# Patient Record
Sex: Female | Born: 1995 | Race: White | Hispanic: No | Marital: Single | Smoking: Never smoker
Health system: Southern US, Community
[De-identification: ages and names within clinical notes are randomized; demographics above are authoritative.]

---
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
## 2014-01-18 ENCOUNTER — Ambulatory Visit (INDEPENDENT_AMBULATORY_CARE_PROVIDER_SITE_OTHER): Payer: No Typology Code available for payment source | Admitting: Physician Assistant

## 2014-01-18 ENCOUNTER — Encounter: Payer: Self-pay | Admitting: Physician Assistant

## 2014-01-18 VITALS — BP 100/68 | HR 80 | Temp 97.9°F | Ht 64.0 in | Wt 131.0 lb

## 2014-01-18 DIAGNOSIS — Z309 Encounter for contraceptive management, unspecified: Secondary | ICD-10-CM | POA: Insufficient documentation

## 2014-01-18 DIAGNOSIS — Z3009 Encounter for other general counseling and advice on contraception: Secondary | ICD-10-CM

## 2014-01-18 DIAGNOSIS — Z30011 Encounter for initial prescription of contraceptive pills: Secondary | ICD-10-CM

## 2014-01-18 MED ORDER — DESOGESTREL-ETHINYL ESTRADIOL 0.15-0.02/0.01 MG (21/5) PO TABS
1.0000 | ORAL_TABLET | Freq: Every day | ORAL | Status: AC
Start: 2014-01-18 — End: ?

## 2014-01-18 NOTE — Progress Notes (Signed)
    Patient ID: AARUSHI HEMRIC MRN: [id number], DOB: 10-20-1995, 18 y.o. Date of Encounter: 01/18/2014, 3:16 PM    Chief Complaint:  Chief Complaint  Patient presents with  .     per patient-  needing birth control     HPI: 18 y.o. year old white female here to discuss contraception. She says that she has already thought about it and has decided she wants to do birth control pill. Says that she can set an alarm on herself and to make sure she takes it on a daily basis. She says that she just ended her menstrual cycle yesterday. She says that her menses come on very regularly every 28 days. She says that they last about 5 days and aren't kind of heavy in the beginning but then did very light. She reports that she has very little cramping.  She has no GYN history the She has no history of hypertension. She does not smoke.     Home Meds:  None  Allergies:  Allergies  Allergen Reactions  . Excedrin Back & [Acetaminophen-Aspirin Buffered]     unknown      Review of Systems: See HPI for pertinent ROS. All other ROS negative.    Physical Exam: Blood pressure 100/68, pulse 80, temperature 97.9 F (36.6 C), temperature source Oral, height  (1.626 m), weight 131 lb (59.421 kg)., Body mass index is 22.47 kg/(m^2). General: WNWD WF.  Appears in no acute distress. Neck: Supple. No thyromegaly. No lymphadenopathy. Lungs: Clear bilaterally to auscultation without wheezes, rales, or rhonchi. Breathing is unlabored. Heart: Regular rhythm. No murmurs, rubs, or gallops. Abdomen: Soft, non-tender, non-distended with normoactive bowel sounds. No hepatomegaly. No rebound/guarding. No obvious abdominal masses. Msk:  Strength and tone normal for age. Extremities/Skin: Warm and dry. Neuro: Alert and oriented X 3. Moves all extremities spontaneously. Gait is normal. CNII-XII grossly in tact. Psych:  Responds to questions appropriately with a normal affect.     ASSESSMENT AND PLAN:    18 y.o. year old female with  1. Encounter for initial prescription of contraceptive pills - desogestrel-ethinyl estradiol (KARIVA,AZURETTE,MIRCETTE) 0.15-0.02/0.01 MG (21/5) tablet; Take 1 tablet by mouth daily.  Dispense: 1 Package; Refill: 11 I discussed how to take this properly including when to start the pills and to take them the same time of day each day. Also discussed to make sure to use another contraceptive method for the first 1-2 months. Followup in one year or followup sooner if needed.  [address] [place], [place], [facility] 01/18/2014 3:16 PM

## 2014-02-21 ENCOUNTER — Telehealth: Payer: Self-pay | Admitting: Physician Assistant

## 2014-02-21 NOTE — Telephone Encounter (Signed)
Pt wanted to know when she could have unprotected sex.  She said the provider said to wait until she has completed at least two packets.  She said she is thur 1 1/2.  Told patient to follow provider directions.

## 2014-02-21 NOTE — Telephone Encounter (Signed)
Patient is calling to speak with you regarding her birth control  Please call her back at [phone]

## 2014-03-07 ENCOUNTER — Ambulatory Visit: Payer: No Typology Code available for payment source | Admitting: Physician Assistant

## 2014-03-09 ENCOUNTER — Ambulatory Visit (INDEPENDENT_AMBULATORY_CARE_PROVIDER_SITE_OTHER): Payer: No Typology Code available for payment source | Admitting: Family Medicine

## 2014-03-09 ENCOUNTER — Encounter: Payer: Self-pay | Admitting: Family Medicine

## 2014-03-09 VITALS — BP 118/68 | HR 78 | Temp 98.1°F | Resp 14 | Ht 63.0 in | Wt 132.0 lb

## 2014-03-09 DIAGNOSIS — M792 Neuralgia and neuritis, unspecified: Secondary | ICD-10-CM

## 2014-03-09 NOTE — Progress Notes (Addendum)
Patient ID: Sara Arnold, female   DOB: 12/10/1995, 18 y.o.   MRN: [id number]   Subjective:    Patient ID: Sara CoddingtonSavannah G Arnold, female    DOB: 08/04/1995, 18 y.o.   MRN: [id number]  Patient presents for Hand Numbness patient here with intermittent numbness of her right hand. She states that she has been donating plasma month ago she donated plasma and she had a traumatic experience since then she had pain and numbness at the elbow site but then she would get some tingling in her fingertips. This over the past couple weeks has slowly faded the last time she had any tingling was a week ago. She went back to give plasma when she told them about the event they told her that she needs to be seen by her physician. She does not work on a computer on a regular basis otherwise has no pain in her joints has not dropped any items.    Review Of Systems:  GEN- denies fatigue, fever, weight loss,weakness, recent illness HEENT- denies eye drainage, change in vision, nasal discharge, CVS- denies chest pain, palpitations RESP- denies SOB, cough, wheeze ABD- denies N/V, change in stools, abd pain GU- denies dysuria, hematuria, dribbling, incontinence MSK- denies joint pain, muscle aches, injury Neuro- denies headache, dizziness, syncope, seizure activity       Objective:    BP 118/68 mmHg  Pulse 78  Temp(Src) 98.1 F (36.7 C) (Oral)  Resp 14  Ht 5\' 3"  (1.6 m)  Wt 132 lb (59.875 kg)  BMI 23.39 kg/m2  LMP 03/02/2014 GEN- NAD, alert and oriented x3 MSK- FROM upper ext, and elbow, skin normal, neg  Neuro- normal strength UE 5/5, DTR symmetric, normal tone, phalens, neg tinels, normal grasp        Assessment & Plan:      Problem List Items Addressed This Visit    None    Visit Diagnoses    Neuropathic pain    -  Possible due to nerve irritation with the traumatic blood draw, already resolved, no further intervention,        Note: This dictation was prepared with Dragon dictation along  with smaller phrase technology. Any transcriptional errors that result from this process are unintentional.

## 2014-03-09 NOTE — Patient Instructions (Signed)
No medications needed Nerve pain will heal itself F/U as needed

## 2014-03-15 ENCOUNTER — Telehealth: Payer: Self-pay | Admitting: Physician Assistant

## 2014-12-24 ENCOUNTER — Encounter (HOSPITAL_COMMUNITY): Payer: Self-pay | Admitting: Vascular Surgery

## 2014-12-24 ENCOUNTER — Emergency Department (HOSPITAL_COMMUNITY)
Admission: EM | Admit: 2014-12-24 | Discharge: 2014-12-25 | Disposition: A | Discharge status: Home / Self Care | Payer: Self-pay | Attending: Emergency Medicine | Admitting: Emergency Medicine

## 2014-12-24 ENCOUNTER — Emergency Department (HOSPITAL_COMMUNITY): Payer: Self-pay

## 2014-12-24 DIAGNOSIS — Z3202 Encounter for pregnancy test, result negative: Secondary | ICD-10-CM | POA: Insufficient documentation

## 2014-12-24 DIAGNOSIS — Z79899 Other long term (current) drug therapy: Secondary | ICD-10-CM | POA: Insufficient documentation

## 2014-12-24 DIAGNOSIS — R519 Headache, unspecified: Secondary | ICD-10-CM

## 2014-12-24 DIAGNOSIS — R51 Headache: Secondary | ICD-10-CM | POA: Insufficient documentation

## 2014-12-24 DIAGNOSIS — F419 Anxiety disorder, unspecified: Secondary | ICD-10-CM | POA: Insufficient documentation

## 2014-12-24 DIAGNOSIS — F410 Panic disorder [episodic paroxysmal anxiety] without agoraphobia: Secondary | ICD-10-CM

## 2014-12-24 LAB — I-STAT BETA HCG BLOOD, ED (MC, WL, AP ONLY): I-stat hCG, quantitative: <5 m[IU]/mL (ref <5)

## 2014-12-24 NOTE — ED Provider Notes (Signed)
CSN: [id number]     Arrival date & time 12/24/14  1836 History   First MD Initiated Contact with Patient 12/24/14 2149     Chief Complaint  Patient presents with  . Headache  . Panic Attack     (Consider location/radiation/quality/duration/timing/severity/associated sxs/prior Treatment) HPI Comments: The patient is a 19 year old female, she states that she is "prone to anxiety". She also has intermittent headaches in the past however over the last 3 weeks she has had frequent headaches, they come on for an hour then they go away for a couple hours then they come back. They're usually located across the frontal area but sometimes bitemporal. She has had 2 episodes of near-syncope over the last several days, she is having frequent panic attacks which she describes as chest discomfort, episodes of heavy rapid breathing and feeling that her vision is closing in. This then resolved spontaneously and she gets back to normal. Today has been relatively benign but she wanted to get checked out because of the progressive nature of her headaches. She denies numbness weakness difficulty ambulating or speech difficulties. She has been taking ibuprofen and occasional BC powder.  Patient is a 19 y.o. female presenting with headaches. The history is provided by the patient.  Headache   History reviewed. No pertinent past medical history. History reviewed. No pertinent past surgical history. No family history on file. Social History  Substance Use Topics  . Smoking status: Never Smoker   . Smokeless tobacco: None  . Alcohol Use: Yes     Comment: occasionally   OB History    No data available     Review of Systems  Neurological: Positive for headaches.  All other systems reviewed and are negative.     Allergies  Excedrin extra strength  Home Medications   Prior to Admission medications   Medication Sig Start Date End Date Taking? Authorizing Provider  desogestrel-ethinyl estradiol  (KARIVA,AZURETTE,MIRCETTE) 0.15-0.02/0.01 MG (21/5) tablet Take 1 tablet by mouth daily. Patient not taking: Reported on 12/24/2014 01/18/14   Dorena Bodo, PA-C  ibuprofen (ADVIL,MOTRIN) 800 MG tablet Take 1 tablet (800 mg total) by mouth 3 (three) times daily. 12/25/14   Eber Hong, MD  SUMAtriptan (IMITREX) 25 MG tablet Take 2 tablets (50 mg total) by mouth every 2 (two) hours as needed for migraine (ongoing headache). Maximum daily dose  12/25/14   Eber Hong, MD   BP 103/48 mmHg  Pulse 92  Temp(Src) 98 F (36.7 C) (Oral)  Resp 16  SpO2 97%  LMP 12/10/2014 (Approximate) Physical Exam  Constitutional: She appears well-developed and well-nourished. No distress.  HENT:  Head: Normocephalic and atraumatic.  Mouth/Throat: Oropharynx is clear and moist. No oropharyngeal exudate.  Eyes: Conjunctivae and EOM are normal. Pupils are equal, round, and reactive to light. Right eye exhibits no discharge. Left eye exhibits no discharge. No scleral icterus.  Neck: Normal range of motion. Neck supple. No JVD present. No thyromegaly present.  Cardiovascular: Normal rate, regular rhythm, normal heart sounds and intact distal pulses.  Exam reveals no gallop and no friction rub.   No murmur heard. Pulmonary/Chest: Effort normal and breath sounds normal. No respiratory distress. She has no wheezes. She has no rales.  Abdominal: Soft. Bowel sounds are normal. She exhibits no distension and no mass. There is no tenderness.  Musculoskeletal: Normal range of motion. She exhibits no edema or tenderness.  Lymphadenopathy:    She has no cervical adenopathy.  Neurological: She is alert. Coordination normal.  Neurologic  exam:  Speech clear, pupils equal round reactive to light, extraocular movements intact  Normal peripheral visual fields Cranial nerves III through XII normal including no facial droop Follows commands, moves all extremities x4, normal strength to bilateral upper and lower extremities at  all major muscle groups including grip Sensation normal to light touch and pinprick Coordination intact, no limb ataxia, finger-nose-finger normal Rapid alternating movements normal No pronator drift Gait normal   Skin: Skin is warm and dry. No rash noted. No erythema.  Psychiatric: She has a normal mood and affect. Her behavior is normal.  Nursing note and vitals reviewed.   ED Course  Procedures (including critical care time) Labs Review Labs Reviewed  I-STAT CHEM 8, ED  I-STAT BETA HCG BLOOD, ED ([facility], [facility], [facility] ONLY)    Imaging Review Ct Head Wo Contrast  12/24/2014   CLINICAL DATA:  Headaches and panic attacks. Intermittent headaches for 2-3 weeks. Dizziness tonight. Syncope. No known injury.  EXAM: CT HEAD WITHOUT CONTRAST  TECHNIQUE: Contiguous axial images were obtained from the base of the skull through the vertex without intravenous contrast.  COMPARISON:  None.  FINDINGS: Ventricles and sulci appear symmetrical. No mass effect or midline shift. No abnormal extra-axial fluid collections. Gray-white matter junctions are distinct. Basal cisterns are not effaced. No evidence of acute intracranial hemorrhage. No depressed skull fractures. Visualized paranasal sinuses and mastoid air cells are not opacified.  IMPRESSION: No acute intracranial abnormalities.   Electronically Signed   By: Burman Nieves M.D.   On: 12/24/2014 23:21   I have personally reviewed and evaluated these images and lab results as part of my medical decision-making.    MDM   Final diagnoses:  Nonintractable headache, unspecified chronicity pattern, unspecified headache type  Anxiety attack    The patient is unremarkable, normal vital signs, normal neurologic exam, the patient is extremely nervous that she has an aneurysm. We'll obtain a CT scan of the brain, check chemistry, check pregnancy, no headache at this time.  Pt stable, no c/o at thsi time of d/c - informed of results - she is in a  greement.  Meds given in ED:  Medications - No data to display  New Prescriptions   IBUPROFEN (ADVIL,MOTRIN) 800 MG TABLET    Take 1 tablet (800 mg total) by mouth 3 (three) times daily.   SUMATRIPTAN (IMITREX) 25 MG TABLET    Take 2 tablets (50 mg total) by mouth every 2 (two) hours as needed for migraine (ongoing headache). Maximum daily dose 200mg       Eber Hong, MD 12/25/14 0001

## 2014-12-24 NOTE — ED Notes (Addendum)
Pt reports to the ED for eval of HAs and panic attacks. She reports she has had intermittent HAs x 2-3 weeks. She reports she has been getting them every day. She reports that occasionally they are migraine in nature. She reports with those she has light and sounds sensitivity. Also reports increased episodes of panic attacks. Pt denies any head injury or N/V. Pt A&OX4, resp e/u, and skin warm and dry. Denies any HA at this time.

## 2014-12-25 MED ORDER — SUMATRIPTAN SUCCINATE 25 MG PO TABS: 50.0000 mg | ORAL_TABLET | ORAL | Status: AC | PRN

## 2014-12-25 MED ORDER — IBUPROFEN 800 MG PO TABS: 800.0000 mg | ORAL_TABLET | Freq: Three times a day (TID) | ORAL | Status: AC

## 2014-12-25 NOTE — Discharge Instructions (Signed)

## 2016-03-28 IMAGING — CT CT HEAD W/O CM
1 series · 16 of 29 positions shown, 20 images · non-contrast
Comparison: None.

CLINICAL DATA: Headaches and panic attacks. Intermittent headaches
for 2-3 weeks. Dizziness tonight. Syncope. No known injury.

EXAM:
CT HEAD WITHOUT CONTRAST
TECHNIQUE: Contiguous axial images were obtained from the base of the skull
through the vertex without intravenous contrast.

[Series 2: head 5.0 h30s · axial · 0.41mm/px · z∈[+1118,+1248]mm · 16 of 29 slices shown, 20 images]
[im 2/29  brain]
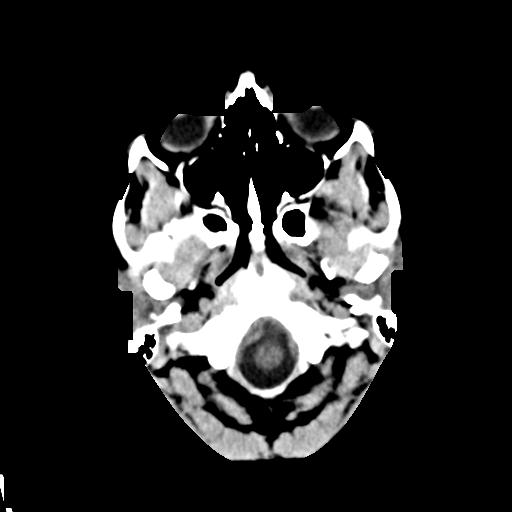
[im 2/29  bone]
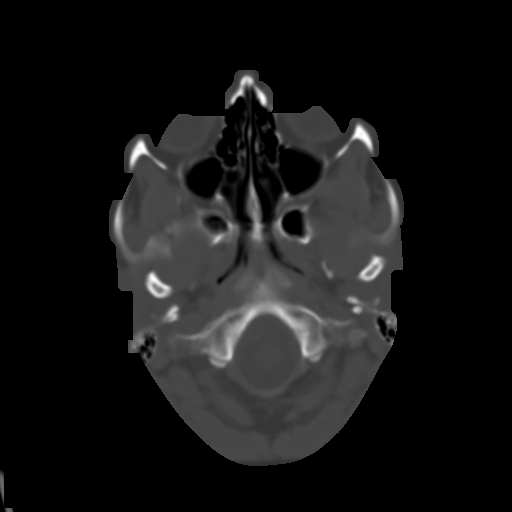
[im 4/29  brain]
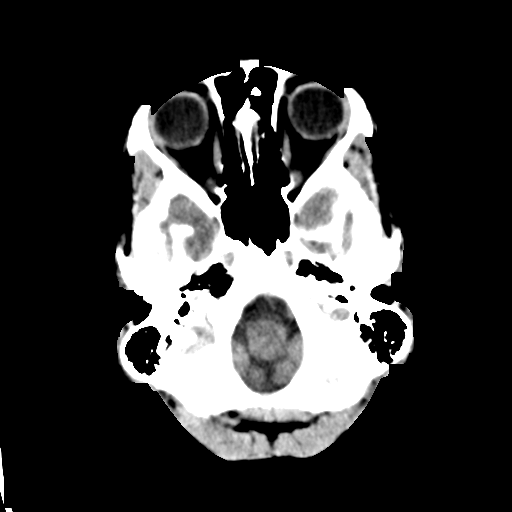
[im 6/29  brain]
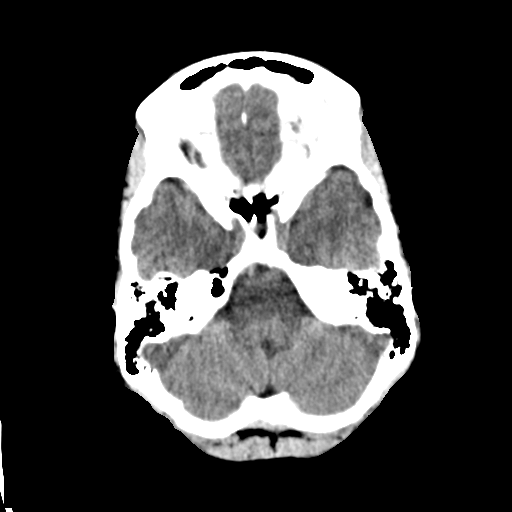
[im 7/29  brain]
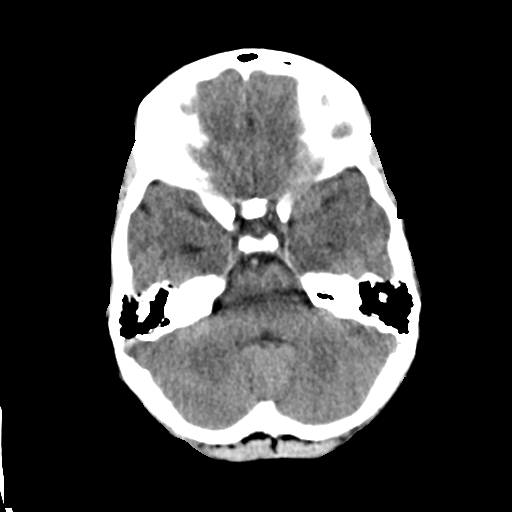
[im 9/29  brain]
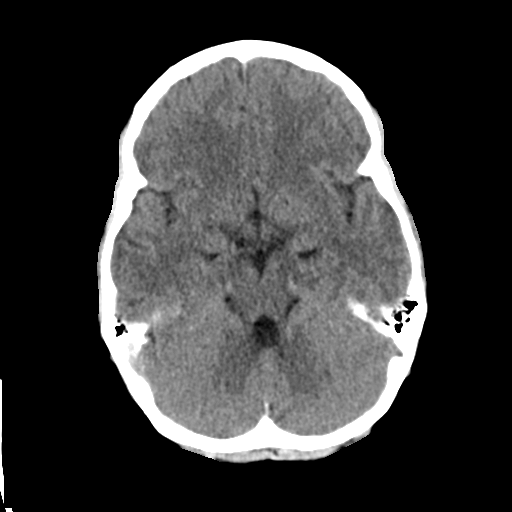
[im 9/29  bone]
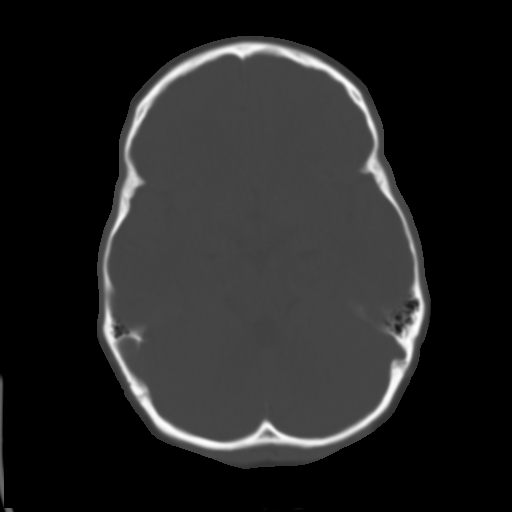
[im 11/29  brain]
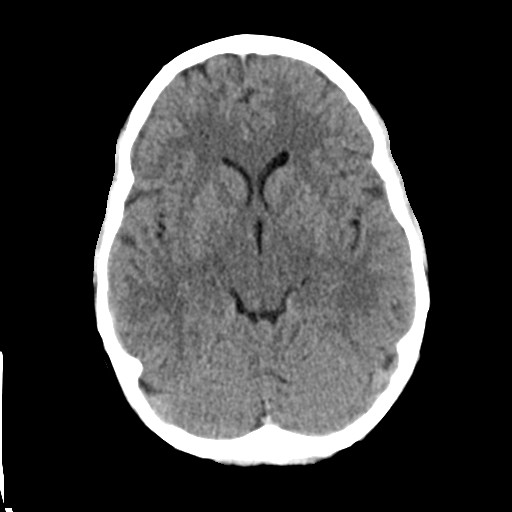
[im 12/29  brain]
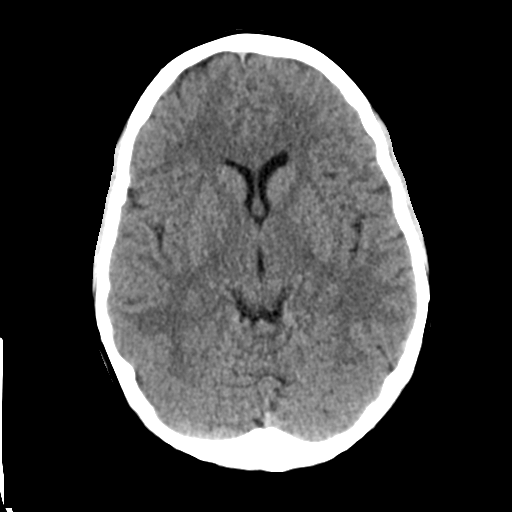
[im 14/29  brain]
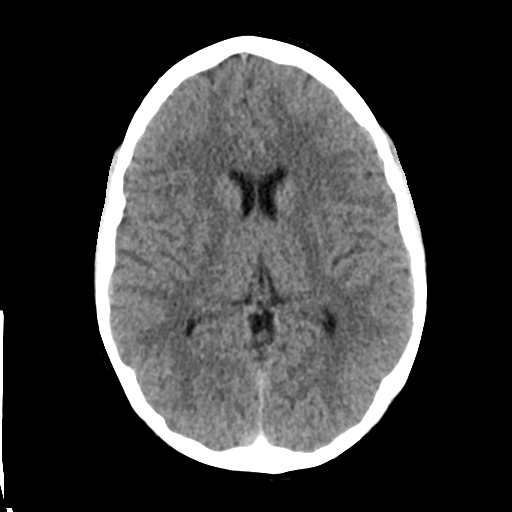
[im 16/29  brain]
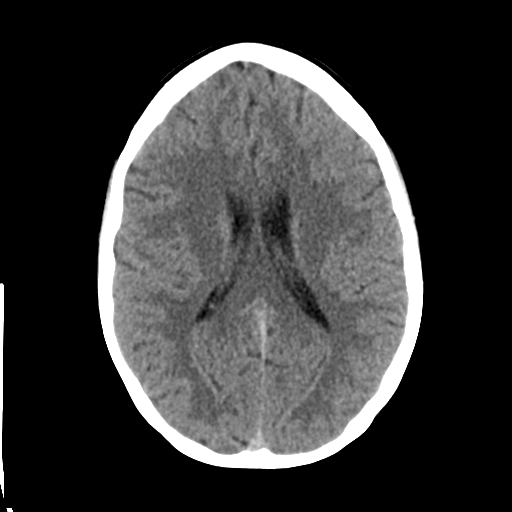
[im 16/29  bone]
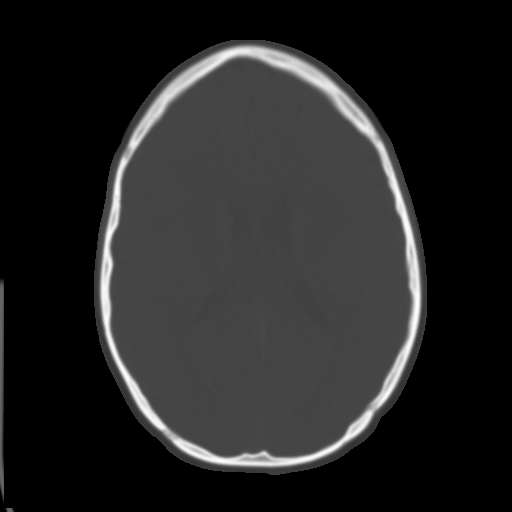
[im 18/29  brain]
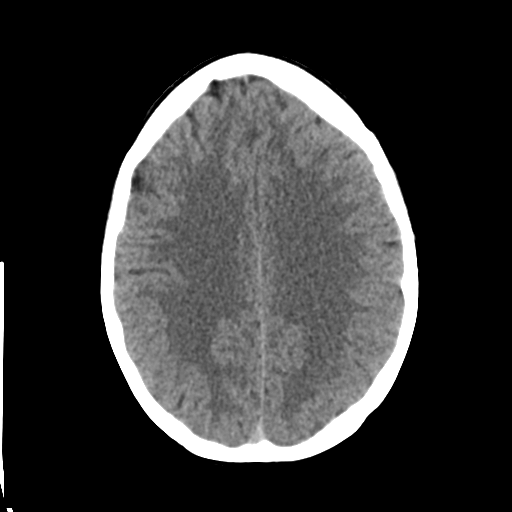
[im 19/29  brain]
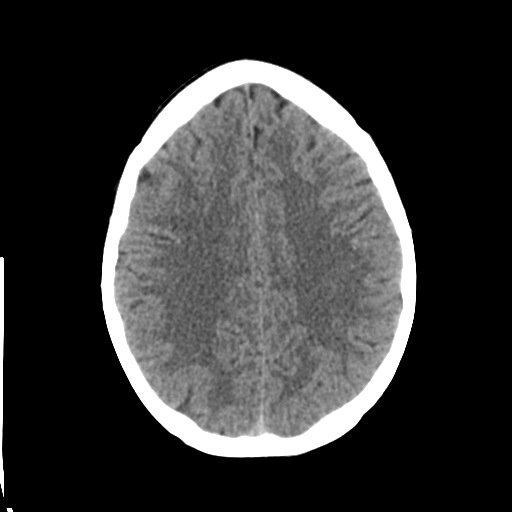
[im 21/29  brain]
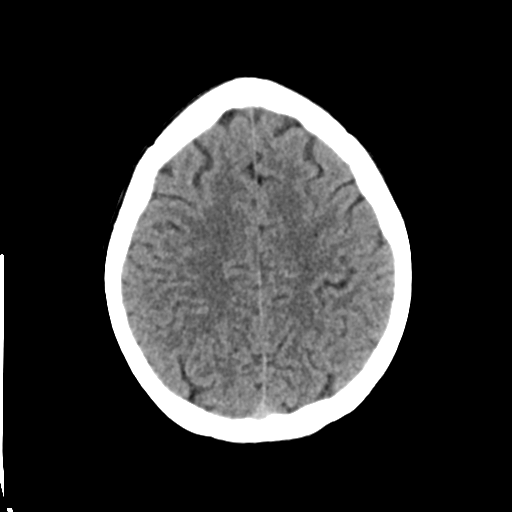
[im 23/29  brain]
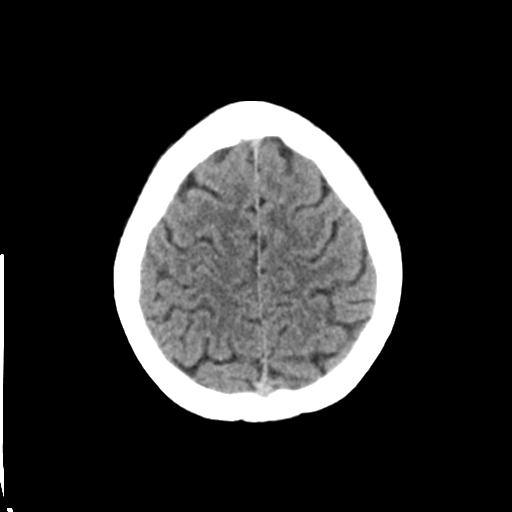
[im 23/29  bone]
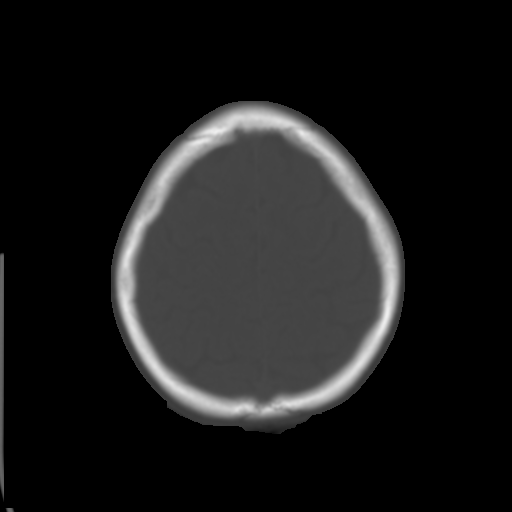
[im 24/29  brain]
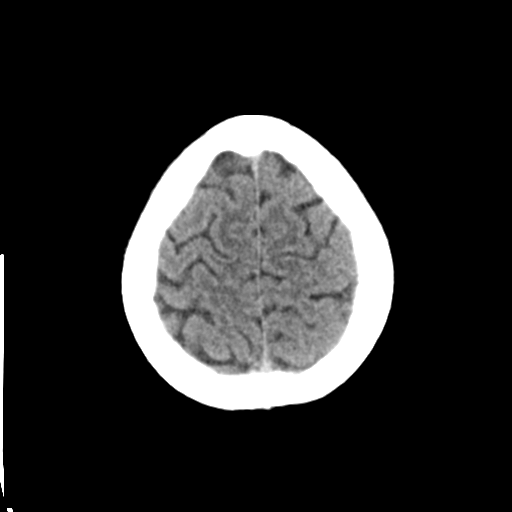
[im 26/29  brain]
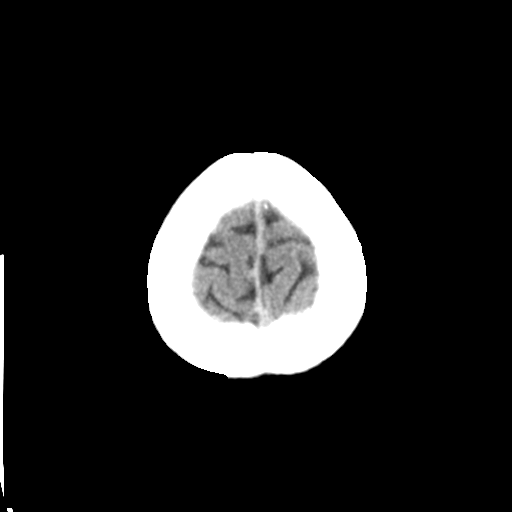
[im 28/29  brain]
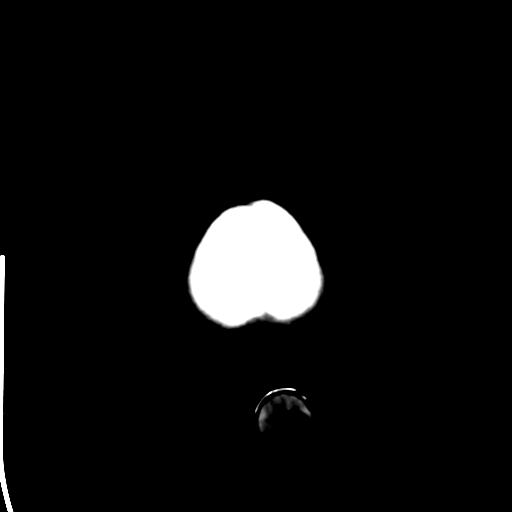

[16 of 29 positions shown; findings below may reference images not displayed]

FINDINGS: Ventricles and sulci appear symmetrical. No mass effect or midline
shift. No abnormal extra-axial fluid collections. Gray-white matter
junctions are distinct. Basal cisterns are not effaced. No evidence
of acute intracranial hemorrhage. No depressed skull fractures.
Visualized paranasal sinuses and mastoid air cells are not
opacified.
IMPRESSION: No acute intracranial abnormalities.

## 2018-03-14 ENCOUNTER — Other Ambulatory Visit: Payer: Self-pay | Admitting: Obstetrics and Gynecology
